# Patient Record
Sex: Female | Born: 1960 | Race: White | Hispanic: No | Marital: Married | Smoking: Current every day smoker
Health system: Southern US, Community
[De-identification: ages and names within clinical notes are randomized; demographics above are authoritative.]

---

## 2020-10-18 ENCOUNTER — Ambulatory Visit (INDEPENDENT_AMBULATORY_CARE_PROVIDER_SITE_OTHER): Payer: No Typology Code available for payment source

## 2020-10-18 ENCOUNTER — Other Ambulatory Visit: Payer: Self-pay

## 2020-10-18 ENCOUNTER — Ambulatory Visit (INDEPENDENT_AMBULATORY_CARE_PROVIDER_SITE_OTHER): Payer: No Typology Code available for payment source | Admitting: Orthopaedic Surgery

## 2020-10-18 ENCOUNTER — Encounter: Payer: Self-pay | Admitting: Orthopaedic Surgery

## 2020-10-18 VITALS — BP 125/87 | HR 93 | Ht 62.0 in | Wt 178.0 lb

## 2020-10-18 DIAGNOSIS — M545 Low back pain, unspecified: Secondary | ICD-10-CM

## 2020-10-18 NOTE — Progress Notes (Signed)
Office Visit Note   Patient: Nancy Mcgee           Date of Birth: 06-28-1961           MRN: [id number] Visit Date: 10/18/2020              Requested by: No referring provider defined for this encounter. PCP: No primary care provider on file.   Assessment & Plan: Visit Diagnoses:  1. Acute right-sided low back pain, unspecified whether sciatica present     Plan: Work slip given for no work x4 weeks.  We will set up physical therapy and I will recheck her in 4 weeks.  If she gets better in the interim feels like she can work half day she will call let us know.  X-ray results were reviewed.  Follow-Up Instructions: No follow-ups on file.   Orders:  Orders Placed This Encounter  Procedures  . XR Pelvis 1-2 Views  . XR Lumbar Spine 2-3 Views   No orders of the defined types were placed in this encounter.     Procedures: No procedures performed   Clinical Data: No additional findings.   Subjective: Chief Complaint  Patient presents with  . Lower Back - Pain    OTJI 09/19/2020  . 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 [facility].  Patient was injured on 09/19/2020 when she was pulling a case cart out of a cart washer and had to pull up and over and heard a sharp pop in her back.  She had significant pain in her back on the right side radiating into her right hip with soreness.  She has had problems standing up straight.  She states she normally walks 2 miles a day but try to walk after the injury and only made it 1/4 mile.  She does better if she is leaning over.  She cannot sit very long has trouble standing.  She tried doing some work and has been out of work since 09/26/2020.  She states the pain shoots in the right leg feels like electricity.   Patient states that she had one episode where her leg gave way she grabs something did not hit the floor.  She went to have  some chiropractic treatment when she was in [place].  She states she is sensitive some medication Tylenol did not help ibuprofen 800 mg took the edge off.  She has not tried Aleve for this.  She wants to avoid pain medication specifically narcotics or muscle relaxants.  Patient's had had some past problems with her back 2013 2019 with mild discomfort not severe or radicular-like current symptoms.  Review of Systems all other systems noncontributory to HPI.  Patient does have scoliosis.  No previous lumbar surgeries.   Objective: Vital Signs: BP 125/87   Pulse 93   Ht 5\' 2"  (1.575 m)   Wt 178 lb (80.7 kg)   BMI 32.56 kg/m   Physical Exam Constitutional:      Appearance: She is well-developed.  HENT:     Head: Normocephalic.     Right Ear: External ear normal.     Left Ear: External ear normal.  Eyes:     Pupils: Pupils are equal, round, and reactive to light.  Neck:     Thyroid: No thyromegaly.     Trachea: No tracheal deviation.  Cardiovascular:  Rate and Rhythm: Normal rate.  Pulmonary:     Effort: Pulmonary effort is normal.  Abdominal:     Palpations: Abdomen is soft.  Skin:    General: Skin is warm and dry.  Neurological:     Mental Status: She is alert and oriented to person, place, and time.  Psychiatric:        Behavior: Behavior normal.     Ortho Exam patient is able to ambulate.  She has trouble getting a fully upright position.  She has tenderness of the right sciatic notch moderately tenderness over the gluteus medius more severe in trochanteric bursa.  Increased pain and weakness with attempted flex right hip.  No problems with left hip flexion.  Quads are strong.  Positive straight leg raising on the right at 80 degrees positive contralateral straight leg raise 90 degrees on the left.  No sciatic notch tenderness.  EHL anterior tib gastrocsoleus is strong no atrophy good capillary refill pedal pulses are normal.  Specialty Comments:  No specialty  comments available.  Imaging: No results found.   PMFS History: There are no problems to display for this patient.  No past medical history on file.  No family history on file.   Social History   Occupational History  . Not on file  Tobacco Use  . Smoking status: Current Every Day Smoker  . Smokeless tobacco: Never Used  Substance and Sexual Activity  . Alcohol use: Yes    Comment: occasional  . Drug use: Not on file  . Sexual activity: Not on file

## 2020-10-18 NOTE — Addendum Note (Signed)
Addended by: Rogers Seeds on: 10/18/2020 10:55 AM   Modules accepted: Orders

## 2020-10-20 ENCOUNTER — Telehealth: Payer: Self-pay | Admitting: Orthopaedic Surgery

## 2020-10-20 NOTE — Telephone Encounter (Signed)
Peggy from [employer] called requesting an order for patient to receive physical therapy and she also is requesting office visit notes. Please call Peggy at [phone]. Please fax notes to [phone].

## 2020-10-20 NOTE — Telephone Encounter (Signed)
Another company by the name of [employer] ask for physical therapy orders be faxed to them at [phone].

## 2020-10-21 NOTE — Telephone Encounter (Signed)
Have you heard from Workers comp on this case? Someone from [facility] is needing PT orders and I dont want to give to them unless its legite

## 2020-10-21 NOTE — Telephone Encounter (Signed)
Can you help with this please.

## 2020-10-25 ENCOUNTER — Telehealth: Payer: Self-pay | Admitting: *Deleted

## 2020-10-25 NOTE — Telephone Encounter (Signed)
-----   Message from Bridgett Larsson sent at 10/24/2020 10:50 AM EDT ----- Regarding: SECURE  PT order for Shelda Pal  I forward the PT order to [facility] they possibly needed the signature of Dr. Ophelia Charter on the referral regarding the PT. This was already originally sent to Erasmo Downer Attn: Gigi Gin and she forwarded it them on 10/20/20 when I sent it to her. I have taken care of resending to [facility]. Thx Tisha

## 2020-11-30 ENCOUNTER — Ambulatory Visit (INDEPENDENT_AMBULATORY_CARE_PROVIDER_SITE_OTHER): Payer: No Typology Code available for payment source | Admitting: Orthopaedic Surgery

## 2020-11-30 ENCOUNTER — Encounter: Payer: Self-pay | Admitting: Orthopaedic Surgery

## 2020-11-30 DIAGNOSIS — M5416 Radiculopathy, lumbar region: Secondary | ICD-10-CM

## 2020-11-30 NOTE — Progress Notes (Signed)
Office Visit Note   Patient: Nancy Mcgee           Date of Birth: 07/08/1961           MRN: [id number] Visit Date: 11/30/2020              Requested by: No referring provider defined for this encounter. PCP: No primary care provider on file.   Assessment & Plan: Visit Diagnoses:  1. Lumbar back pain with radiculopathy affecting right lower extremity     Plan: Patient needs an MRI scan lumbar spine rule out disc protrusion with radiculopathy with weak hip flexion on the right.  Office follow-up after MRI scan for review.  Work slip given no work pending office visit for MRI review.  Follow-Up Instructions: No follow-ups on file.   Orders:  No orders of the defined types were placed in this encounter.  No orders of the defined types were placed in this encounter.     Procedures: No procedures performed   Clinical Data: No additional findings.   Subjective: Chief Complaint  Patient presents with  . Lower Back - Pain, Follow-up    HPI 60 year old female returns for follow-up Worker's Comp. injury date of injury was 08/2820.  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 was pulling a case cart out of a cart washer had to pull up and felt sharp pain in her back.  She has been through therapy which is not helping she is miserable states she is having great difficulty getting dressed she has problems walking outside take her dogs out.  She normally been extremely active.  She has back pain over the SI joint that radiates laterally to the crest into the left buttocks.  Pain radiates around to her groin.  She does have a history of kidney stones in the past on the right this was many years ago which states this pain is not like her kidney stone pain.  She denies any hematuria no frequency no chills or fever.  No associated bowel or bladder symptoms.  Therapy has not given her any relief.  She does have some Percocet at home from last year when she  had some plastic surgery procedure for postop pain but states she does not like to take the medication because of the way it makes her feel but she does have available.  She has been through chiropractic treatments, Tylenol, ibuprofen, prednisone injection.  Review of Systems 14 point system update unchanged.  Patient does have scoliosis no previous lumbar surgeries.   Objective: Vital Signs: BP 127/82   Pulse 80   Ht 5\' 2"  (1.575 m)   Wt 178 lb (80.7 kg)   BMI 32.56 kg/m   Physical Exam Constitutional:      Appearance: She is well-developed.  HENT:     Head: Normocephalic.     Right Ear: External ear normal.     Left Ear: External ear normal.  Eyes:     Pupils: Pupils are equal, round, and reactive to light.  Neck:     Thyroid: No thyromegaly.     Trachea: No tracheal deviation.  Cardiovascular:     Rate and Rhythm: Normal rate.  Pulmonary:     Effort: Pulmonary effort is normal.  Abdominal:     Palpations: Abdomen is soft.  Skin:    General: Skin is warm and dry.  Neurological:     Mental Status: She is alert and oriented to  person, place, and time.  Psychiatric:        Behavior: Behavior normal.     Ortho Exam patient has reproduction of pain with right hip flexion weakness.  Slight quad weakness abductor strong.  Some pain with straight leg raising 80 degrees on the right negative on the left.  Anterior tib EHL is intact no lower extremity swelling pulses are normal good capillary refill. Specialty Comments:  No specialty comments available.  Imaging: No results found.   PMFS History: Patient Active Problem List   Diagnosis Date Noted  . Lumbar back pain with radiculopathy affecting right lower extremity 11/30/2020   History reviewed. No pertinent past medical history.  History reviewed. No pertinent family history.  History reviewed. No pertinent surgical history. Social History   Occupational History  . Not on file  Tobacco Use  . Smoking status:  Current Every Day Smoker  . Smokeless tobacco: Never Used  Substance and Sexual Activity  . Alcohol use: Yes    Comment: occasional  . Drug use: Not on file  . Sexual activity: Not on file

## 2020-11-30 NOTE — Addendum Note (Signed)
Addended by: Barbette Or on: 11/30/2020 09:42 AM   Modules accepted: Orders

## 2020-12-02 ENCOUNTER — Telehealth: Payer: Self-pay

## 2020-12-02 NOTE — Telephone Encounter (Signed)
Shamika from [employer] called she is requesting a script for the patients mri she stated she is trying to get the patient scheduled call back:[phone] [phone]

## 2020-12-02 NOTE — Telephone Encounter (Signed)
Script has been faxed

## 2020-12-06 ENCOUNTER — Telehealth: Payer: Self-pay

## 2020-12-06 NOTE — Telephone Encounter (Signed)
Order was faxed.

## 2020-12-06 NOTE — Telephone Encounter (Signed)
Nancy Mcgee from [facility] [employer] called she is requesting a rx for mri of lumbar spine without contrast , the patient wants to be scheduled at [facility] call back:[phone]

## 2020-12-13 ENCOUNTER — Telehealth: Payer: Self-pay | Admitting: Orthopaedic Surgery

## 2020-12-13 NOTE — Telephone Encounter (Signed)
Sandy with [facility] [employer] called stating the MRI referral was missing the signed order and [facility] won't schedule until they have that. She would like this faxed to [facility] as soon as possible please.   [facility] F# [phone] Claim# [id number]

## 2020-12-13 NOTE — Telephone Encounter (Signed)
Can you send this or do I need to print and stamp?

## 2020-12-13 NOTE — Telephone Encounter (Signed)
noted 

## 2020-12-13 NOTE — Telephone Encounter (Signed)
Can you please fax for me?

## 2020-12-13 NOTE — Telephone Encounter (Signed)
Received call from Belva Agee case manager with [employer] needing a  Rx faxed to her for an MRI for the patient.  She advised [facility] do not have the referral. Peggy wants the Rx faxed directly to her.     The fax# is [phone]   The number to contact Gigi Gin is [phone]

## 2020-12-14 ENCOUNTER — Other Ambulatory Visit: Payer: Self-pay

## 2020-12-14 ENCOUNTER — Telehealth: Payer: Self-pay

## 2020-12-14 NOTE — Telephone Encounter (Signed)
Dr. Ophelia Charter wrote new rx today for MRI Lumbar that states MUST BE 1.5 TESSLA MAGNET.  New RX faxed to Peggy at [phone].

## 2020-12-14 NOTE — Telephone Encounter (Signed)
Tammy called and a new fax needs to be sent with the pts Date of Birth and stating that the MRI is without contrast.  Attn : Tammy   She stated she spoke with Dr. Ophelia Charter this am and the one he faxed over was without the DOB and Mri information   Fax # [phone]

## 2020-12-16 NOTE — Telephone Encounter (Signed)
Rx has been sent  

## 2020-12-20 ENCOUNTER — Other Ambulatory Visit: Payer: Self-pay

## 2020-12-20 ENCOUNTER — Ambulatory Visit
Admission: RE | Admit: 2020-12-20 | Discharge: 2020-12-20 | Disposition: A | Discharge status: Home / Self Care | Payer: Worker's Compensation | Source: Ambulatory Visit | Attending: Orthopaedic Surgery | Admitting: Orthopaedic Surgery

## 2020-12-20 DIAGNOSIS — M5416 Radiculopathy, lumbar region: Secondary | ICD-10-CM

## 2020-12-27 ENCOUNTER — Ambulatory Visit (INDEPENDENT_AMBULATORY_CARE_PROVIDER_SITE_OTHER): Payer: Worker's Compensation | Admitting: Orthopaedic Surgery

## 2020-12-27 ENCOUNTER — Encounter: Payer: Self-pay | Admitting: Orthopaedic Surgery

## 2020-12-27 VITALS — BP 117/80 | HR 86 | Ht 62.0 in | Wt 178.0 lb

## 2020-12-27 DIAGNOSIS — M5416 Radiculopathy, lumbar region: Secondary | ICD-10-CM

## 2020-12-27 DIAGNOSIS — M48061 Spinal stenosis, lumbar region without neurogenic claudication: Secondary | ICD-10-CM

## 2020-12-27 NOTE — Progress Notes (Signed)
Office Visit Note   Patient: Nancy Mcgee           Date of Birth: 07/01/1961           MRN: [id number] Visit Date: 12/27/2020              Requested by: No referring provider defined for this encounter. PCP: Pcp, No   Assessment & Plan: Visit Diagnoses:  1. Lumbar back pain with radiculopathy affecting right lower extremity   2. Lumbar foraminal stenosis     Plan: Patient scan shows significant degenerative changes at L1-2 with 5 mm retrolisthesis and moderate subarticular foraminal stenosis on the right consistent with her symptoms to radiate into the right groin and anterior thigh.  She does have some disc protrusion with severe stenosis at L4-5 but no neurogenic claudication symptoms.  Would recommend a FCE for determination of her job capacity at this point.  We discussed the endplate degenerative changes present at L1-2 on the right consistent with her current symptoms.  We discussed possible consideration of epidural injection in the foramina at some point.  She does have some curvature and does have some fairly severe foraminal stenosis at L4-5 but has not been having any claudication symptoms and also has some L3-4 moderate to severe stenosis.  At times she has had some lower back pain into her buttocks in the past but her current pain is likely related to the L1-2 level with disc protrusion and endplate edema.  We will obtain his FCE and see her back after FCE for work recommendations.  Follow-Up Instructions: No follow-ups on file.   Orders:  Orders Placed This Encounter  Procedures  . Ambulatory referral to Physical Therapy   No orders of the defined types were placed in this encounter.     Procedures: No procedures performed   Clinical Data: No additional findings.   Subjective: Chief Complaint  Patient presents with  . Lower Back - Pain, Follow-up    MRI lumbar review    HPI 60 year old female returns for follow-up Worker's Comp. injury from 09/19/2020.   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 [place] when she was pulling a case cart out of a cart washer and pulling hard on it she felt a sharp pain in her back.  Patient's had persistent problems with significant pain preventing her from work.  She has used prednisone, injection, Tylenol, ibuprofen.  She had some Percocet at home that she took used.  Patient states pain is been this severe, and can past standing and she significantly wants to get better so that she can get back to work.  Patient has been through therapeutic exercise with chiropractor.  Review of Systems systems update unchanged from 11/30/2020.  Of note is scoliosis.   Objective: Vital Signs: BP 117/80   Pulse 86   Ht 5\' 2"  (1.575 m)   Wt 178 lb (80.7 kg)   BMI 32.56 kg/m   Physical Exam  Ortho Exam  Specialty Comments:  No specialty comments available.  Imaging: CLINICAL DATA:  Low back pain.  Right buttock and hip pain.  EXAM: MRI LUMBAR SPINE WITHOUT CONTRAST  TECHNIQUE: Multiplanar, multisequence MR imaging of the lumbar spine was performed. No intravenous contrast was administered.  COMPARISON:  Lumbar spine radiographs 10/18/2020  FINDINGS: Segmentation:  Normal  Alignment:  Moderate levoscoliosis at L1-2.  5 mm retrolisthesis L1-2.  Mild anterolisthesis L4-5.  Vertebrae: Negative for fracture or mass. Bone marrow edema on  the right at L1-2 related to disc degeneration.  Conus medullaris and cauda equina: Conus extends to the L1-2 level. Conus and cauda equina appear normal.  Paraspinal and other soft tissues: Negative for paraspinous mass or adenopathy.  Disc levels:  T12-L1: Asymmetric disc degeneration and spurring on the right related to scoliosis. Moderate right foraminal stenosis.  L1-2: 5 mm retrolisthesis. Disc degeneration with diffuse endplate spurring, right greater than left. Bilateral mild facet degeneration. Mild spinal  stenosis. Moderate subarticular and foraminal stenosis on the right. Mild stenosis on the left  L2-3: Disc degeneration with broad-based central and left-sided disc protrusion. Associated endplate spurring. Bilateral facet degeneration. Moderate subarticular and foraminal stenosis on the left. Mild spinal stenosis  L3-4: Disc degeneration with disc bulging and endplate spurring. Disc degeneration asymmetric to the left. Moderate to advanced facet degeneration. Moderate to severe spinal stenosis. Moderate subarticular stenosis on the left. Moderate to severe subarticular and foraminal stenosis on the left  L4-5: Central disc protrusion and advanced facet degeneration. Severe spinal stenosis. Moderate to severe subarticular and foraminal stenosis on the left. Moderate right subarticular stenosis  L5-S1: Bilateral facet degeneration.  Negative for stenosis.  IMPRESSION: Lumbar scoliosis. Multilevel degenerative changes throughout the lumbar spine. Negative for fracture  Right foraminal stenosis T12-L1 and L1-2.  Moderate foraminal stenosis on the left at L2-3 with mild spinal stenosis  Moderate to severe spinal stenosis L3-4 with bilateral stenosis left greater than right  Severe spinal stenosis L4-5 with moderate to severe subarticular foraminal stenosis on the left.   Electronically Signed   By: Marlan Palau M.D.   On: 12/20/2020 17:17   PMFS History: Patient Active Problem List   Diagnosis Date Noted  . Lumbar foraminal stenosis 12/28/2020  . Lumbar back pain with radiculopathy affecting right lower extremity 11/30/2020   No past medical history on file.  No family history on file.  No past surgical history on file. Social History   Occupational History  . Not on file  Tobacco Use  . Smoking status: Current Every Day Smoker  . Smokeless tobacco: Never Used  Substance and Sexual Activity  . Alcohol use: Yes    Comment: occasional  . Drug use:  Not on file  . Sexual activity: Not on file

## 2020-12-28 DIAGNOSIS — M48061 Spinal stenosis, lumbar region without neurogenic claudication: Secondary | ICD-10-CM | POA: Insufficient documentation

## 2021-01-27 ENCOUNTER — Telehealth: Payer: Self-pay | Admitting: Orthopaedic Surgery

## 2021-01-27 NOTE — Telephone Encounter (Signed)
Pt called stating she is a WC pt and has a report from [facility] being faxed to Dr.Yates. She wants to make sure he receives it and has time to look over before her appt on 02/15/21. Pt would like a CB when our office gets this so she can know if she'll have to move her appt.   [phone]

## 2021-01-30 NOTE — Telephone Encounter (Signed)
Have you seen this paperwork?

## 2021-01-30 NOTE — Telephone Encounter (Signed)
She must be referring to the FCE. We have not received that (or any other report) as of yet. Thanks.

## 2021-01-31 ENCOUNTER — Telehealth: Payer: Self-pay | Admitting: Orthopaedic Surgery

## 2021-01-31 NOTE — Telephone Encounter (Signed)
You have not received this on patient have you?

## 2021-01-31 NOTE — Telephone Encounter (Signed)
Pt called requesting a call back. Pt has questions about if Dr. Ophelia Charter received her evaluation paperwork. Please call patient at [phone].

## 2021-01-31 NOTE — Telephone Encounter (Signed)
I left voicemail for patient. I have checked with both Tammy and Tisha and we have not received any paperwork. I did advise that Morrison Old has reached out to work comp [employer]. I am holding previous message so that I can follow up.

## 2021-02-01 NOTE — Telephone Encounter (Signed)
Have you received any new information?

## 2021-02-07 ENCOUNTER — Ambulatory Visit (INDEPENDENT_AMBULATORY_CARE_PROVIDER_SITE_OTHER): Payer: No Typology Code available for payment source | Admitting: Orthopaedic Surgery

## 2021-02-07 ENCOUNTER — Encounter: Payer: Self-pay | Admitting: Orthopaedic Surgery

## 2021-02-07 VITALS — BP 118/78 | HR 79 | Ht 63.0 in | Wt 188.0 lb

## 2021-02-07 DIAGNOSIS — M5416 Radiculopathy, lumbar region: Secondary | ICD-10-CM

## 2021-02-07 NOTE — Telephone Encounter (Signed)
Patient aware FCE has been received.

## 2021-02-07 NOTE — Progress Notes (Signed)
Office Visit Note   Patient: Nancy Mcgee           Date of Birth: May 18, 1961           MRN: [id number] Visit Date: 02/07/2021              Requested by: No referring provider defined for this encounter. PCP: Pcp, No   Assessment & Plan: Visit Diagnoses:  1. Lumbar back pain with radiculopathy affecting right lower extremity     Plan: Patient has had some improvement originally she could barely transfer out of her wheelchair had trouble standing could barely walk.  She does have moderately severe stenosis at L3-4 multifactorial.  Severe stenosis at L4-5 without neurogenic claudication symptoms or history of claudication.  Patient has foraminal stenosis at L1-2 on the right which seems to be the symptomatic level and recommend referral to Dr. Alvester Morin for single epidural steroid injection see if we get her some relief and improve her physical capacity.  Currently since she is not able to travel by car more than a short distance she is prohibited from working.  I plan to recheck her after the epidural.  FCE results and MRI scan was reviewed again with the patient and  her husband.  Follow-Up Instructions: Return in about 1 month (around 03/10/2021).   Orders:  Orders Placed This Encounter  Procedures   Ambulatory referral to Physical Medicine Rehab   No orders of the defined types were placed in this encounter.     Procedures: No procedures performed   Clinical Data: No additional findings.   Subjective: Chief Complaint  Patient presents with   Lower Back - Follow-up    FCE review    HPI 60 year old female returns for follow-up Worker's Comp. injury from 09/19/2020.  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  FCE has been obtained and is available for review.  Patient's MRI showed some degenerative changes L1-2 with 5 mm retrolisthesis and moderate foraminal  stenosis on the right.  Her pain symptoms radiate into the right groin and anterior thigh.  She did have severe stenosis at L4-5 however she had no neurogenic claudication symptoms and no pain that radiated to her feet.  She did have a past history of some back pain symptoms in the past.  FCE is reviewed with her today.  FCE says patient follows in a modified light duty routine some sit some stand & instruments up to 4 hours without performing lifting more than 5 pounds at a time.  She is able to perform light pushing or pulling activities.  Twisting within the amount of weight is not recommended.  Squatting test not recommended.  Her current job requires her to drive up to 13 to 14 hours per FCE.  This is not recommended due to her level of pain per FCE findings.  Review of Systems 14 point system update from 11/30/2020 unchanged.   Objective: Vital Signs: BP 118/78   Pulse 79   Ht 5\' 3"  (1.6 m)   Wt 188 lb (85.3 kg)   BMI 33.30 kg/m   Physical Exam Constitutional:      Appearance: She is well-developed.  HENT:     Head: Normocephalic.     Right Ear: External ear normal.     Left Ear: External ear normal. There is no impacted cerumen.  Eyes:     Pupils:  Pupils are equal, round, and reactive to light.  Neck:     Thyroid: No thyromegaly.     Trachea: No tracheal deviation.  Cardiovascular:     Rate and Rhythm: Normal rate.  Pulmonary:     Effort: Pulmonary effort is normal.  Abdominal:     Palpations: Abdomen is soft.  Musculoskeletal:     Cervical back: No rigidity.  Skin:    General: Skin is warm and dry.  Neurological:     Mental Status: She is alert and oriented to person, place, and time.  Psychiatric:        Behavior: Behavior normal.    Ortho Exam patient has pain with attempted resisted right hip flexion.  Slight quad weakness abductors are strong.  Negative logroll the hips.  Knees reach full extension.  Anterior tib EHL is intact.  Distal pulses  palpable.  Specialty Comments:  No specialty comments available.  Imaging: No results found.   PMFS History: Patient Active Problem List   Diagnosis Date Noted   Lumbar foraminal stenosis 12/28/2020   Lumbar back pain with radiculopathy affecting right lower extremity 11/30/2020   No past medical history on file.  No family history on file.  No past surgical history on file. Social History   Occupational History   Not on file  Tobacco Use   Smoking status: Every Day   Smokeless tobacco: Never  Substance and Sexual Activity   Alcohol use: Yes    Comment: occasional   Drug use: Not on file   Sexual activity: Not on file

## 2021-02-15 ENCOUNTER — Ambulatory Visit: Payer: Self-pay | Admitting: Orthopaedic Surgery

## 2021-02-17 ENCOUNTER — Telehealth: Payer: Self-pay | Admitting: Physical Medicine and Rehabilitation

## 2021-02-17 NOTE — Telephone Encounter (Signed)
Pt called stating she has a referral and she's a WC pt so she wants to make sure she doesn't miss any calls to set her appt.   [phone]

## 2021-03-01 ENCOUNTER — Ambulatory Visit (INDEPENDENT_AMBULATORY_CARE_PROVIDER_SITE_OTHER): Payer: No Typology Code available for payment source | Admitting: Physical Medicine and Rehabilitation

## 2021-03-01 ENCOUNTER — Ambulatory Visit: Payer: Self-pay

## 2021-03-01 ENCOUNTER — Encounter: Payer: Self-pay | Admitting: Physical Medicine and Rehabilitation

## 2021-03-01 ENCOUNTER — Other Ambulatory Visit: Payer: Self-pay

## 2021-03-01 VITALS — BP 104/72 | HR 86

## 2021-03-01 DIAGNOSIS — M5416 Radiculopathy, lumbar region: Secondary | ICD-10-CM

## 2021-03-01 DIAGNOSIS — M48061 Spinal stenosis, lumbar region without neurogenic claudication: Secondary | ICD-10-CM

## 2021-03-01 MED ORDER — DEXAMETHASONE SODIUM PHOSPHATE 10 MG/ML IJ SOLN
15.0000 mg | Freq: Once | INTRAMUSCULAR | Status: AC
  Administered 2021-03-01: 15 mg

## 2021-03-01 NOTE — Patient Instructions (Signed)

## 2021-03-01 NOTE — Progress Notes (Signed)
Pt state lower back pain that travels to her right hip. Pt state any movement makes the pain worse. Pt state uses ice to help ease her pain.  Numeric Pain Rating Scale and Functional Assessment Average Pain 6   In the last MONTH (on 0-10 scale) has pain interfered with the following?  1. General activity like being  able to carry out your everyday physical activities such as walking, climbing stairs, carrying groceries, or moving a chair?  Rating(10)   +Driver, -BT, -Dye Allergies.

## 2021-03-01 NOTE — Procedures (Signed)
Lumbosacral Transforaminal Epidural Steroid Injection - Sub-Pedicular Approach with Fluoroscopic Guidance  Patient: Nancy Mcgee      Date of Birth: Apr 25, 1961 MRN: [id number] PCP: Pcp, No      Visit Date: 03/01/2021   Universal Protocol:    Date/Time: 03/01/2021  Consent Given By: the patient  Position: PRONE  Additional Comments: Vital signs were monitored before and after the procedure. Patient was prepped and draped in the usual sterile fashion. The correct patient, procedure, and site was verified.   Injection Procedure Details:   Procedure diagnoses: Lumbar radiculopathy [M54.16]    Meds Administered:  Meds ordered this encounter  Medications   dexamethasone (DECADRON) injection 15 mg    Laterality: Right  Location/Site:  L1-L2  Needle:5.0 in., 22 ga.  Short bevel or Quincke spinal needle  Needle Placement: Transforaminal  Findings:    -Comments: Excellent flow of contrast along the nerve, nerve root and into the epidural space.  Procedure Details: After squaring off the end-plates to get a true AP view, the C-arm was positioned so that an oblique view of the foramen as noted above was visualized. The target area is just inferior to the "nose of the scotty dog" or sub pedicular. The soft tissues overlying this structure were infiltrated with 2-3 ml. of 1% Lidocaine without Epinephrine.  The spinal needle was inserted toward the target using a "trajectory" view along the fluoroscope beam.  Under AP and lateral visualization, the needle was advanced so it did not puncture dura and was located close the 6 O'Clock position of the pedical in AP tracterory. Biplanar projections were used to confirm position. Aspiration was confirmed to be negative for CSF and/or blood. A 1-2 ml. volume of Isovue-250 was injected and flow of contrast was noted at each level. Radiographs were obtained for documentation purposes.   After attaining the desired flow of contrast documented  above, a 0.5 to 1.0 ml test dose of 0.25% Marcaine was injected into each respective transforaminal space.  The patient was observed for 90 seconds post injection.  After no sensory deficits were reported, and normal lower extremity motor function was noted,   the above injectate was administered so that equal amounts of the injectate were placed at each foramen (level) into the transforaminal epidural space.   Additional Comments:  The patient tolerated the procedure well Dressing: 2 x 2 sterile gauze and Band-Aid    Post-procedure details: Patient was observed during the procedure. Post-procedure instructions were reviewed.  Patient left the clinic in stable condition.

## 2021-03-01 NOTE — Progress Notes (Signed)
Nancy Mcgee - 60 y.o. female MRN [id number]  Date of birth: January 18, 1961  Office Visit Note: Visit Date: 03/01/2021 PCP: Pcp, No Referred by: Eldred Manges, MD  Subjective: Chief Complaint  Patient presents with   Lower Back - Pain   Right Hip - Pain   HPI:  [name] is a 60 y.o. female who comes in today at the request of Dr. Annell Greening for planned Right L1-L2 Lumbar Transforaminal epidural steroid injection with fluoroscopic guidance.  The patient has failed conservative care including home exercise, medications, time and activity modification.  This injection will be diagnostic and hopefully therapeutic.  Please see requesting physician notes for further details and justification. MRI reviewed with images and spine model.  MRI reviewed in the note below. She reports lower back pain that travels to her right hip     ROS Otherwise per HPI.  Assessment & Plan: Visit Diagnoses:    ICD-10-CM   1. Lumbar radiculopathy  M54.16 XR C-ARM NO REPORT    Epidural Steroid injection    dexamethasone (DECADRON) injection 15 mg    2. 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 Spinal stenosis of lumbar region without neurogenic claudication  M48.061       Plan: No additional findings.   Meds & Orders:  Meds ordered this encounter  Medications   dexamethasone (DECADRON) injection 15 mg    Orders Placed This Encounter  Procedures   XR C-ARM NO REPORT   Epidural Steroid injection    Follow-up: Return for Annell Greening, MD.   Procedures: No procedures performed  Lumbosacral Transforaminal Epidural Steroid Injection - Sub-Pedicular Approach with Fluoroscopic Guidance  Patient: Nancy Mcgee      Date of Birth: 29-Jul-1960 MRN: [id number] PCP: Pcp, No      Visit Date: 03/01/2021   Universal Protocol:    Date/Time: 03/01/2021  Consent Given By: the patient  Position: PRONE  Additional Comments: Vital signs were monitored before and after the procedure. Patient was prepped  and draped in the usual sterile fashion. The correct patient, procedure, and site was verified.   Injection Procedure Details:   Procedure diagnoses: Lumbar radiculopathy [M54.16]    Meds Administered:  Meds ordered this encounter  Medications   dexamethasone (DECADRON) injection 15 mg    Laterality: Right  Location/Site:  L1-L2  Needle:5.0 in., 22 ga.  Short bevel or Quincke spinal needle  Needle Placement: Transforaminal  Findings:    -Comments: Excellent flow of contrast along the nerve, nerve root and into the epidural space.  Procedure Details: After squaring off the end-plates to get a true AP view, the C-arm was positioned so that an oblique view of the foramen as noted above was visualized. The target area is just inferior to the "nose of the scotty dog" or sub pedicular. The soft tissues overlying this structure were infiltrated with 2-3 ml. of 1% Lidocaine without Epinephrine.  The spinal needle was inserted toward the target using a "trajectory" view along the fluoroscope beam.  Under AP and lateral visualization, the needle was advanced so it did not puncture dura and was located close the 6 O'Clock position of the pedical in AP tracterory. Biplanar projections were used to confirm position. Aspiration was confirmed to be negative for CSF and/or blood. A 1-2 ml. volume of Isovue-250 was injected and flow of contrast was noted at each level. Radiographs were obtained for documentation purposes.   After attaining the desired flow of contrast documented above, a  0.5 to 1.0 ml test dose of 0.25% Marcaine was injected into each respective transforaminal space.  The patient was observed for 90 seconds post injection.  After no sensory deficits were reported, and normal lower extremity motor function was noted,   the above injectate was administered so that equal amounts of the injectate were placed at each foramen (level) into the transforaminal epidural space.   Additional  Comments:  The patient tolerated the procedure well Dressing: 2 x 2 sterile gauze and Band-Aid    Post-procedure details: Patient was observed during the procedure. Post-procedure instructions were reviewed.  Patient left the clinic in stable condition.    Clinical History: MRI LUMBAR SPINE WITHOUT CONTRAST   TECHNIQUE: Multiplanar, multisequence MR imaging of the lumbar spine was performed. No intravenous contrast was administered.   COMPARISON:  Lumbar spine radiographs 10/18/2020   FINDINGS: Segmentation:  Normal   Alignment:  Moderate levoscoliosis at L1-2.   5 mm retrolisthesis L1-2.  Mild anterolisthesis L4-5.   Vertebrae: Negative for fracture or mass. Bone marrow edema on the right at L1-2 related to disc degeneration.   Conus medullaris and cauda equina: Conus extends to the L1-2 level. Conus and cauda equina appear normal.   Paraspinal and other soft tissues: Negative for paraspinous mass or adenopathy.   Disc levels:   T12-L1: Asymmetric disc degeneration and spurring on the right related to scoliosis. Moderate right foraminal stenosis.   L1-2: 5 mm retrolisthesis. Disc degeneration with diffuse endplate spurring, right greater than left. Bilateral mild facet degeneration. Mild spinal stenosis. Moderate subarticular and foraminal stenosis on the right. Mild stenosis on the left   L2-3: Disc degeneration with broad-based central and left-sided disc protrusion. Associated endplate spurring. Bilateral facet degeneration. Moderate subarticular and foraminal stenosis on the left. Mild spinal stenosis   L3-4: Disc degeneration with disc bulging and endplate spurring. Disc degeneration asymmetric to the left. Moderate to advanced facet degeneration. Moderate to severe spinal stenosis. Moderate subarticular stenosis on the left. Moderate to severe subarticular and foraminal stenosis on the left   L4-5: Central disc protrusion and advanced facet  degeneration. Severe spinal stenosis. Moderate to severe subarticular and foraminal stenosis on the left. Moderate right subarticular stenosis   L5-S1: Bilateral facet degeneration.  Negative for stenosis.   IMPRESSION: Lumbar scoliosis. Multilevel degenerative changes throughout the lumbar spine. Negative for fracture   Right foraminal stenosis T12-L1 and L1-2.   Moderate foraminal stenosis on the left at L2-3 with mild spinal stenosis   Moderate to severe spinal stenosis L3-4 with bilateral stenosis left greater than right   Severe spinal stenosis L4-5 with moderate to severe subarticular foraminal stenosis on the left.     Electronically Signed   By: Marlan Palau M.D.   On: 12/20/2020 17:17     Objective:  VS:  HT:    WT:   BMI:     BP:104/72  HR:86bpm  TEMP: ( )  RESP:  Physical Exam Vitals and nursing note reviewed.  Constitutional:      General: She is not in acute distress.    Appearance: Normal appearance. She is not ill-appearing.  HENT:     Head: Normocephalic and atraumatic.     Right Ear: External ear normal.     Left Ear: External ear normal.  Eyes:     Extraocular Movements: Extraocular movements intact.  Cardiovascular:     Rate and Rhythm: Normal rate.     Pulses: Normal pulses.  Pulmonary:     Effort:  Pulmonary effort is normal. No respiratory distress.  Abdominal:     General: There is no distension.     Palpations: Abdomen is soft.  Musculoskeletal:        General: Tenderness present.     Cervical back: Neck supple.     Right lower leg: No edema.     Left lower leg: No edema.     Comments: Patient has good distal strength with no pain over the greater trochanters.  No clonus or focal weakness.  Skin:    Findings: No erythema, lesion or rash.  Neurological:     General: No focal deficit present.     Mental Status: She is alert and oriented to person, place, and time.     Sensory: No sensory deficit.     Motor: No weakness or  abnormal muscle tone.     Coordination: Coordination normal.  Psychiatric:        Mood and Affect: Mood normal.        Behavior: Behavior normal.     Imaging: No results found.

## 2021-03-14 ENCOUNTER — Other Ambulatory Visit: Payer: Self-pay

## 2021-03-14 ENCOUNTER — Encounter: Payer: Self-pay | Admitting: Orthopaedic Surgery

## 2021-03-14 ENCOUNTER — Ambulatory Visit (INDEPENDENT_AMBULATORY_CARE_PROVIDER_SITE_OTHER): Payer: No Typology Code available for payment source | Admitting: Orthopaedic Surgery

## 2021-03-14 VITALS — BP 130/85 | Ht 63.0 in | Wt 188.0 lb

## 2021-03-14 DIAGNOSIS — M5416 Radiculopathy, lumbar region: Secondary | ICD-10-CM | POA: Diagnosis not present

## 2021-03-14 DIAGNOSIS — M48061 Spinal stenosis, lumbar region without neurogenic claudication: Secondary | ICD-10-CM | POA: Diagnosis not present

## 2021-03-15 NOTE — Progress Notes (Signed)
Office Visit Note   Patient: Nancy Mcgee           Date of Birth: 1961/04/11           MRN: [id number] Visit Date: 03/14/2021              Requested by: No referring provider defined for this encounter. PCP: Pcp, No   Assessment & Plan: Visit Diagnoses:  1. Lumbar back pain with radiculopathy affecting right lower extremity   2. Lumbar foraminal stenosis     Plan: Patient has significant two-level lumbar stenosis moderate severe at L3 -4 and severe at 4 5 without instability at that level.  She also has some significant disc degeneration with retrolisthesis and some foraminal stenosis at L1-2.  I like her to see Dr. Otelia Sergeant for second opinion for his recommendations.  I gave her a note out of work  for  4 weeks pending that evaluation and return visit with me.  Follow-Up Instructions: No follow-ups on file.   Orders:  Orders Placed This Encounter  Procedures   Ambulatory referral to Orthopedic Surgery   No orders of the defined types were placed in this encounter.     Procedures: No procedures performed   Clinical Data: No additional findings.   Subjective: Chief Complaint  Patient presents with   Lower Back - Pain, Follow-up    OTJI 09/19/2020    HPI 60 year old female returns for follow-up with on-the-job injury 09/19/2020.  Patient had a recent epidural 03/01/2021 she states it took the edge off her pain for 4 days and then recurred just as severe.  She has used a TENS unit, inversion table.  She brought a new mattress and may not been able to sleep on it she bought a second mattress.  She has tried ibuprofen and Aleve.  She tried oxycodone leftover from previous surgery and states it really did not help.  She has pain that radiates from the sacral dimple area over the SI joint into the sciatic notch and she rates it as constant and severe she has difficulty walking standing and moving.  She has a rehab nurse Andris Flurry, RN, CCM assigned to her.  Fax number  [phone].  Injection was at the L1-2 level where she had Mobic endplate changes and 5 mm retrolisthesis at L1-2.  Patient has significant stenosis severe at L4-5 and moderate to severe at L3-4 level but had not had problems with pain or neurogenic claudication symptoms prior to her on-the-job injury 09/19/2020.  Patient been treated by chiropractor.  Medications muscle relaxants, anti-inflammatory, prednisone Dosepak.  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  She was pulling a case cart out of a cart washer and had to pull and felt sharp pop in her back.  In the past she is to walk 2 miles a day.  Cannot stand long and has problems walking does better if she leans over a grocery cart.  Patient and had some problems mild with her back back in 2013 and another episode in 2019 which resolved and she is back to normal activity.  When first seen patient been in a wheelchair.  She is ambulatory now.  She has difficulty traveling by car.  FCE was done showed she was compatible with modified light duty some sitting some standing up for hours without lifting more than 5 pounds.  Okay for some light pushing and pulling.  Twisting holding weights was not recommended.  Squatting  test should be avoided.  Her previous job involves significant driving to different hospitals which currently she is not able to do.  Review of Systems 14 point system update she does have scoliosis all other systems are noncontributory to HPI.  No associated bowel or bladder symptoms.  No other rheumatologic conditions.   Objective: Vital Signs: BP 130/85   Ht 5\' 3"  (1.6 m)   Wt 188 lb (85.3 kg)   BMI 33.30 kg/m   Physical Exam Constitutional:      Appearance: She is well-developed.  HENT:     Head: Normocephalic.     Right Ear: External ear normal.     Left Ear: External ear normal. There is no impacted cerumen.  Eyes:     Pupils: Pupils are equal, round, and reactive to light.   Neck:     Thyroid: No thyromegaly.     Trachea: No tracheal deviation.  Cardiovascular:     Rate and Rhythm: Normal rate.  Pulmonary:     Effort: Pulmonary effort is normal.  Abdominal:     Palpations: Abdomen is soft.  Musculoskeletal:     Cervical back: No rigidity.  Skin:    General: Skin is warm and dry.  Neurological:     Mental Status: She is alert and oriented to person, place, and time.  Psychiatric:        Behavior: Behavior normal.    Ortho Exam patient has trouble getting from sitting standing.  Slow deliberate gait.  Tenderness over the right sacroiliac joint and pain over the sciatic notch.  Negative logroll of the hips.  2+ symmetrical lower extremity reflexes.  EHL anterior tib is strong no atrophy.  Distal pulses are intact.  She complains of pain with flexion of the right hip.  Internal and external rotation of the hips are not painful negative logroll.  No pitting edema.  Specialty Comments:  No specialty comments available.  Imaging: No results found.   PMFS History: Patient Active Problem List   Diagnosis Date Noted   Lumbar foraminal stenosis 12/28/2020   Lumbar back pain with radiculopathy affecting right lower extremity 11/30/2020   No past medical history on file.  No family history on file.  No past surgical history on file. Social History   Occupational History   Not on file  Tobacco Use   Smoking status: Every Day   Smokeless tobacco: Never  Substance and Sexual Activity   Alcohol use: Yes    Comment: occasional   Drug use: Not on file   Sexual activity: Not on file

## 2021-03-28 ENCOUNTER — Telehealth: Payer: Self-pay | Admitting: Orthopaedic Surgery

## 2021-03-28 NOTE — Telephone Encounter (Signed)
Please advise 

## 2021-03-28 NOTE — Telephone Encounter (Signed)
I had to put it in as a referral to see Dr. Otelia Sergeant for a second opinion. 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 Hold for [place] for the gabapentin?

## 2021-03-28 NOTE — Telephone Encounter (Signed)
Pt is wondering if she could get some gabapentin prescribed? She would like for someone to call her.   CB [phone]

## 2021-03-29 NOTE — Telephone Encounter (Signed)
Please see below and advise on gabapentin. Thanks.

## 2021-04-03 MED ORDER — GABAPENTIN 100 MG PO CAPS: 100.0000 mg | ORAL_CAPSULE | Freq: Three times a day (TID) | ORAL | 1 refills | Status: DC

## 2021-04-03 NOTE — Telephone Encounter (Signed)
Sent to pharmacy. I called patient and advised. 

## 2021-04-03 NOTE — Addendum Note (Signed)
Addended by: Rogers Seeds on: 04/03/2021 11:29 AM   Modules accepted: Orders

## 2021-05-19 ENCOUNTER — Telehealth: Payer: Self-pay | Admitting: Orthopaedic Surgery

## 2021-05-19 NOTE — Telephone Encounter (Signed)
Andris Flurry with Patent examiner (workers comp) called stating that pt is needing to have all lower back MRI's and xrays to have on a disc. She needs this for IME adjuster and has an appt in [place] to provide these images to them. Bonita Quin says to call pt once ready for p/u and to also keep her in the loop. Her call back is [phone]

## 2021-05-22 NOTE — Telephone Encounter (Signed)
I spoke with Nancy Mcgee at [facility] and they will get CD burned and ready for pick up. I called patient and Nancy Mcgee and advised, CDs ready here and at [facility].

## 2021-05-22 NOTE — Telephone Encounter (Signed)
Could you please burn xrays to CD for me? I will call and let them know she has to pick up MRI at [facility]. Thanks.

## 2021-06-03 ENCOUNTER — Other Ambulatory Visit: Payer: Self-pay | Admitting: Orthopaedic Surgery

## 2021-06-05 NOTE — Telephone Encounter (Signed)
Please advise 

## 2021-07-11 ENCOUNTER — Encounter: Payer: Self-pay | Admitting: Orthopaedic Surgery

## 2021-07-11 ENCOUNTER — Ambulatory Visit (INDEPENDENT_AMBULATORY_CARE_PROVIDER_SITE_OTHER): Payer: No Typology Code available for payment source | Admitting: Orthopaedic Surgery

## 2021-07-11 ENCOUNTER — Other Ambulatory Visit: Payer: Self-pay

## 2021-07-11 VITALS — BP 127/83 | HR 82 | Ht 63.11 in | Wt 189.0 lb

## 2021-07-11 DIAGNOSIS — M5416 Radiculopathy, lumbar region: Secondary | ICD-10-CM | POA: Diagnosis not present

## 2021-07-11 DIAGNOSIS — M48061 Spinal stenosis, lumbar region without neurogenic claudication: Secondary | ICD-10-CM

## 2021-07-11 NOTE — Progress Notes (Signed)
Office Visit Note   Patient: Nancy Mcgee           Date of Birth: 18-Jan-1961           MRN: [id number] Visit Date: 07/11/2021              Requested by: No referring provider defined for this encounter. PCP: Pcp, No   Assessment & Plan: Visit Diagnoses:  1. Lumbar back pain with radiculopathy affecting right lower extremity   2. Lumbar foraminal stenosis     Plan: We reviewed recommendations of her IME.  We reviewed her MRI scan once again images and report.  We will set up for right SI joint under fluoroscopy.  If this is not effective we will proceed with right transforaminal epidural steroid injection to see if this helps.  Once again I discussed with her that I do not have a specific surgical recommendation at this time.  Work slip given no work pending office follow-up after her injection.  Follow-Up Instructions: No follow-ups on file.   Orders:  Orders Placed This Encounter  Procedures   Ambulatory referral to Physical Medicine Rehab   No orders of the defined types were placed in this encounter.     Procedures: No procedures performed   Clinical Data: No additional findings.   Subjective: Chief Complaint  Patient presents with   Lower Back - Pain    OTJI 09/19/2020    HPI 60 year old female returns for follow-up Worker's Comp. injury 09/19/2020.  She has scoliosis, endplate degenerative changes, some stenosis L3-4 L4-5.  I had requested she be seen my partner Dr. Otelia Sergeant for additional opinion and Worker's Comp. sent her to [place] to [employer] for an IME.  This is available for review and also can be viewed in epic.  He mentioned her symptoms on the right were little different some areas that look like they were tighter on the left.  Problems with the dressing part of the problem where she has endplate degenerative changes and problems with scoliosis requiring more extensive stabilization problems were discussed in his note similar to my notes.  He  recommended trying a right sacroiliac injection if that was not helpful try a right transforaminal epidural injection.  Patient states she is very interested in getting back to work and states she loves her job but just does not know what she is going to do since she is in significant pain.  Review of Systems updated unchanged.   Objective: Vital Signs: BP 127/83    Pulse 82    Ht 5' 3.11" (1.603 m)    Wt 189 lb (85.7 kg)    BMI 33.36 kg/m   Physical Exam Constitutional:      Appearance: She is well-developed.  HENT:     Head: Normocephalic.     Right Ear: External ear normal.     Left Ear: External ear normal. There is no impacted cerumen.  Eyes:     Pupils: Pupils are equal, round, and reactive to light.  Neck:     Thyroid: No thyromegaly.     Trachea: No tracheal deviation.  Cardiovascular:     Rate and Rhythm: Normal rate.  Pulmonary:     Effort: Pulmonary effort is normal.  Abdominal:     Palpations: Abdomen is soft.  Musculoskeletal:     Cervical back: No rigidity.  Skin:    General: Skin is warm and dry.  Neurological:     Mental Status: She is alert and  oriented to person, place, and time.  Psychiatric:        Behavior: Behavior normal.    Ortho Exam patient is ambulatory she points directly over the right SI joint where she is having pain.  She leans on the exam room counter with her arms help unload her back.  Anterior tib gastrocsoleus is intact.  Specialty Comments:  No specialty comments available.  Imaging: No results found.   PMFS History: Patient Active Problem List   Diagnosis Date Noted   Lumbar foraminal stenosis 12/28/2020   Lumbar back pain with radiculopathy affecting right lower extremity 11/30/2020   No past medical history on file.  No family history on file.  No past surgical history on file. Social History   Occupational History   Not on file  Tobacco Use   Smoking status: Every Day   Smokeless tobacco: Never  Substance and  Sexual Activity   Alcohol use: Yes    Comment: occasional   Drug use: Not on file   Sexual activity: Not on file

## 2021-07-26 ENCOUNTER — Other Ambulatory Visit: Payer: Self-pay | Admitting: Orthopaedic Surgery

## 2021-10-16 ENCOUNTER — Telehealth: Payer: Self-pay | Admitting: Orthopaedic Surgery

## 2021-10-16 NOTE — Telephone Encounter (Signed)
I spoke with patient. She is going to email me copies of her insurance cards so that we can scan and send to Dr. Macomb Blas scheduler to obtain authorization. ?

## 2021-10-16 NOTE — Telephone Encounter (Signed)
I called patient, call went to voicemail, no indication of who phone belongs to, no message left. ?Will try again. ?

## 2021-10-16 NOTE — Telephone Encounter (Signed)
Pt called requesting a call back from [place]. Pt states she was denied from [facility] to receive injection. Pt would like to pay out of pocket. Please call pt to set this appt with name of injection and price. Pt phone number is [phone] ?

## 2021-10-19 NOTE — Telephone Encounter (Signed)
I  have emailed patient's insurance cards to you. Could you please upload information to system so that I can reach out to Dr. Alvester Morin to see about scheduling? Work comp denied her injection, so she wants to Museum/gallery curator. ?Thanks. ?

## 2021-12-06 ENCOUNTER — Other Ambulatory Visit: Payer: Self-pay | Admitting: Radiology

## 2021-12-06 ENCOUNTER — Telehealth: Payer: Self-pay | Admitting: Radiology

## 2021-12-06 DIAGNOSIS — M5416 Radiculopathy, lumbar region: Secondary | ICD-10-CM

## 2021-12-06 NOTE — Telephone Encounter (Signed)
FYI ? ?Received email from patient inquiring why she had not heard from anyone to schedule injection with Dr. Alvester Morin. She was originally denied by work comp and then decided to Museum/gallery curator. Cassandra uploaded new insurance card for patient into system for me. ? ?I entered new referral. I emailed patient to advise that I am sorry, referral notes to file regular insurance weren't entered, so they did not know to reach out to her. I advised that we will obtain authorization and someone from Dr. Marion Blas area will call her to schedule. ?

## 2021-12-07 ENCOUNTER — Other Ambulatory Visit: Payer: Self-pay | Admitting: Physical Medicine and Rehabilitation

## 2021-12-07 DIAGNOSIS — M461 Sacroiliitis, not elsewhere classified: Secondary | ICD-10-CM

## 2022-02-27 ENCOUNTER — Other Ambulatory Visit: Payer: Self-pay | Admitting: Orthopaedic Surgery

## 2022-03-16 IMAGING — MR MR LUMBAR SPINE W/O CM
4 of 5 series · 24 of 48 positions shown · non-contrast
Comparison: Lumbar spine radiographs 10/18/2020

CLINICAL DATA: Low back pain.  Right buttock and hip pain.

EXAM:
MRI LUMBAR SPINE WITHOUT CONTRAST
TECHNIQUE: Multiplanar, multisequence MR imaging of the lumbar spine was
performed. No intravenous contrast was administered.

[Series 3: T2 · sagittal · 4.0mm · 0.53mm/px · 6 of 16 slices shown (1 of 2)]
[im 1/16]
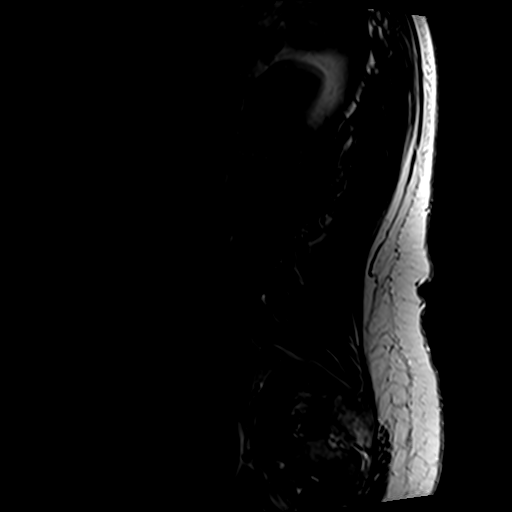
[im 4/16]
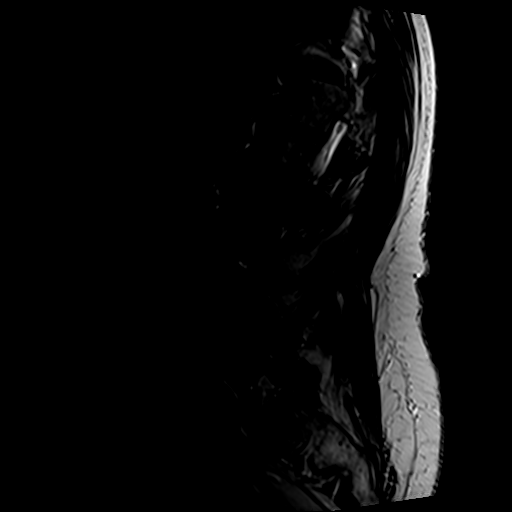
[im 7/16]
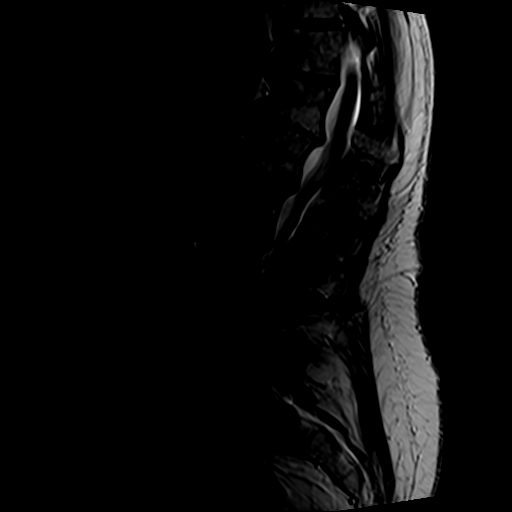
[im 10/16]
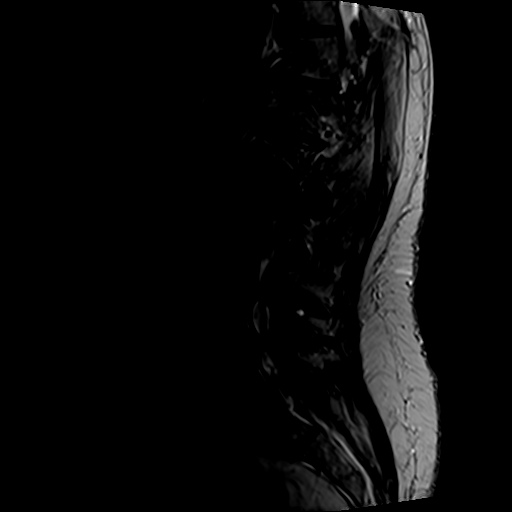
[im 13/16]
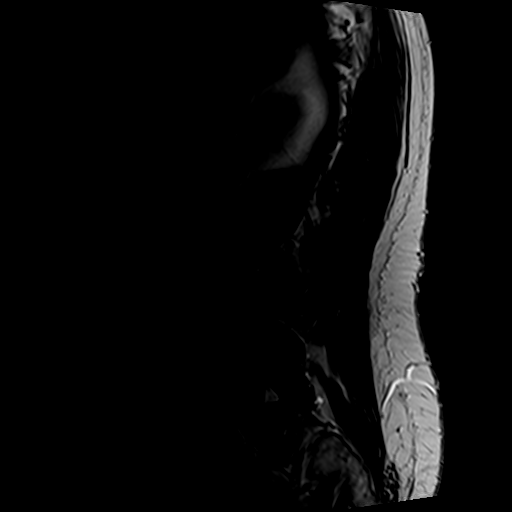
[im 16/16]
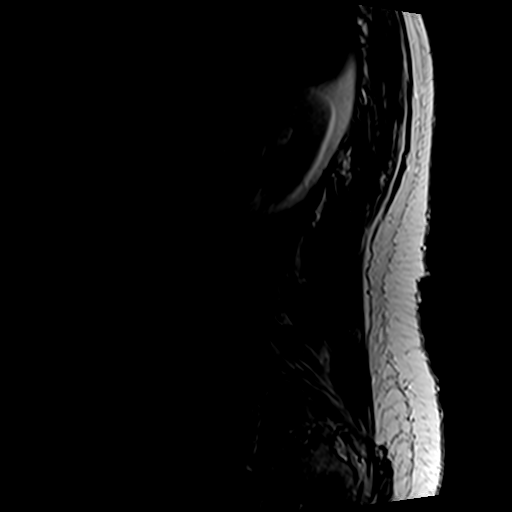

[Series 5: T1 · sagittal · 4.0mm · 0.53mm/px · 7 of 16 slices shown (1 of 2)]
[im 1/16]
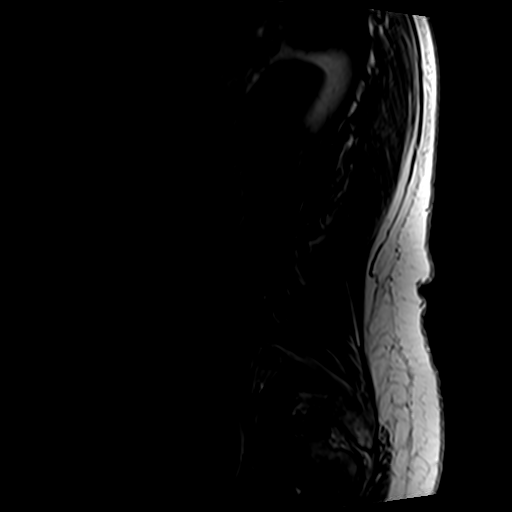
[im 3/16]
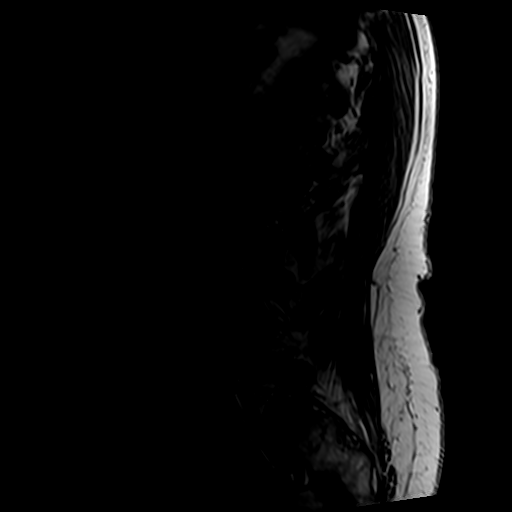
[im 6/16]
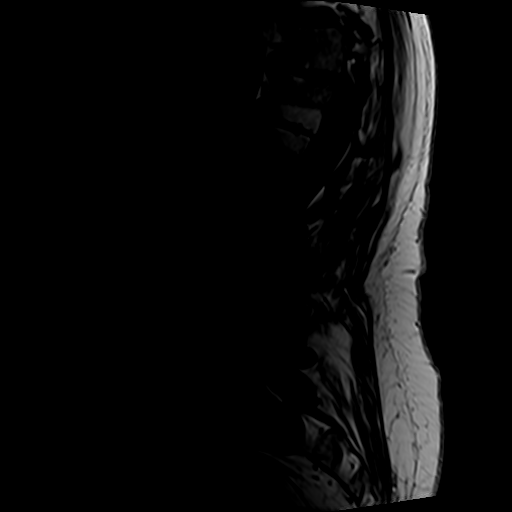
[im 8/16]
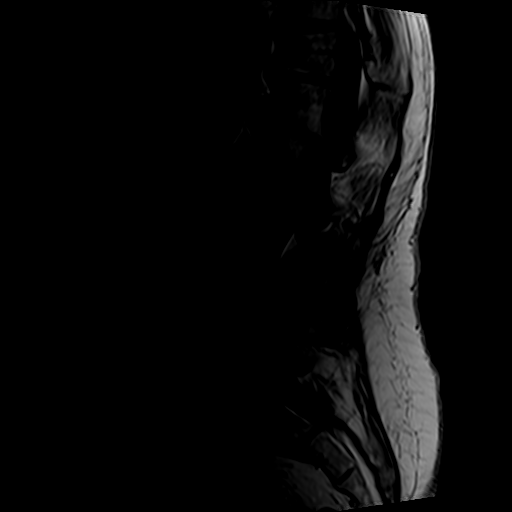
[im 11/16]
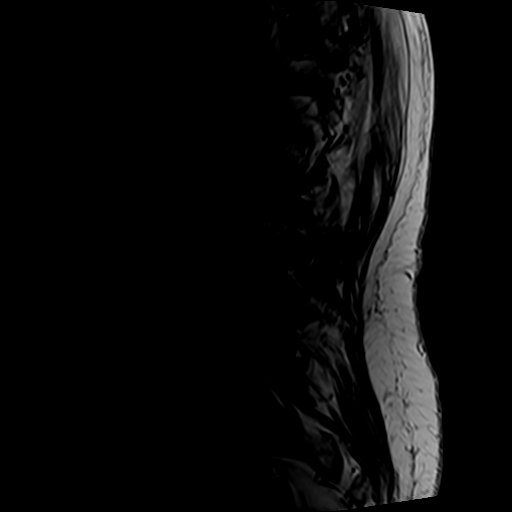
[im 13/16]
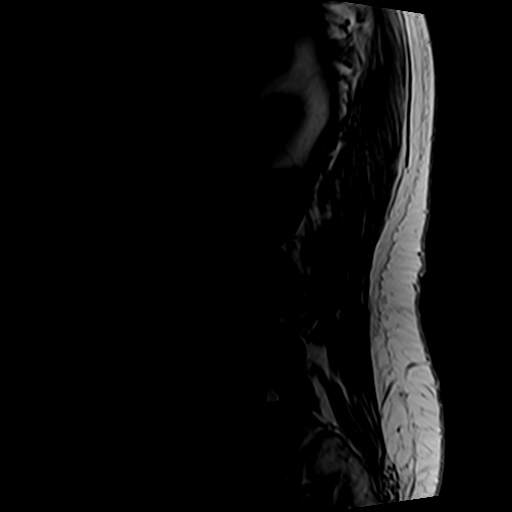
[im 16/16]
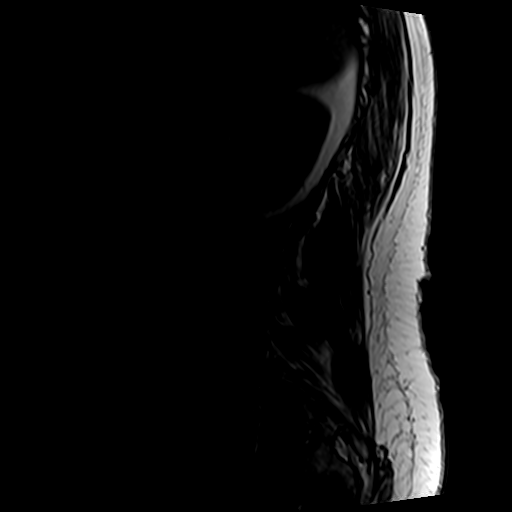

[Series 6: T2 · axial · 4.0mm · 0.70mm/px · z∈[-27,+184]mm · 8 of 32 slices shown (2 of 2)]
[im 1/32]
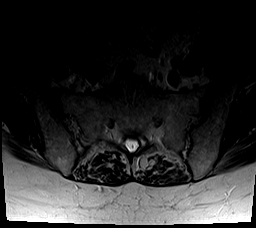
[im 5/32]
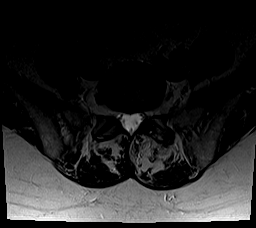
[im 10/32]
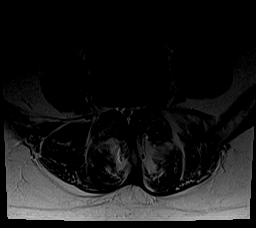
[im 15/32]
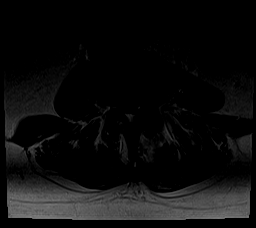
[im 17/32]
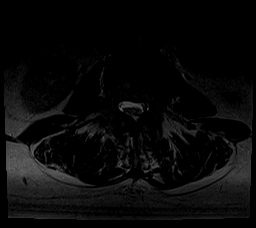
[im 22/32]
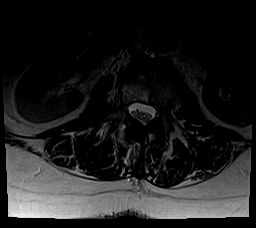
[im 27/32]
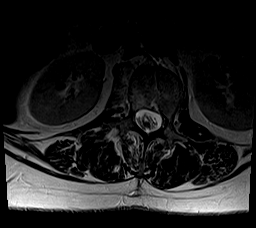
[im 32/32]
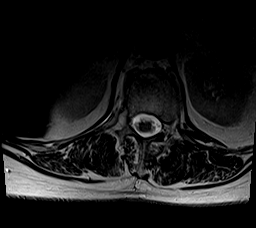

[Series 7: T1 · axial · 4.0mm · 0.35mm/px · z∈[-7,+160]mm · 3 of 32 slices shown (2 of 2)]
[im 5/32]
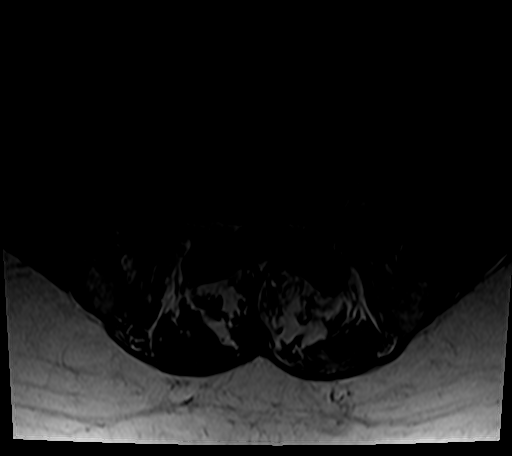
[im 17/32]
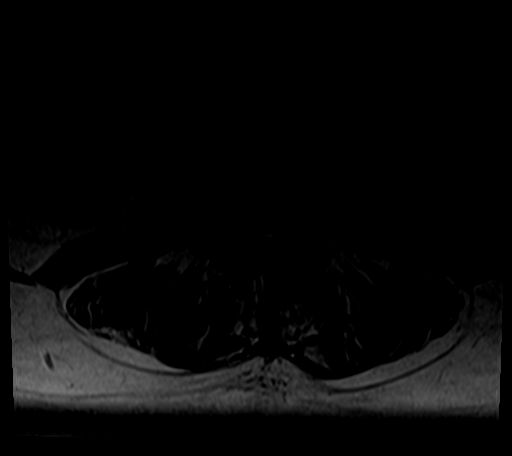
[im 27/32]
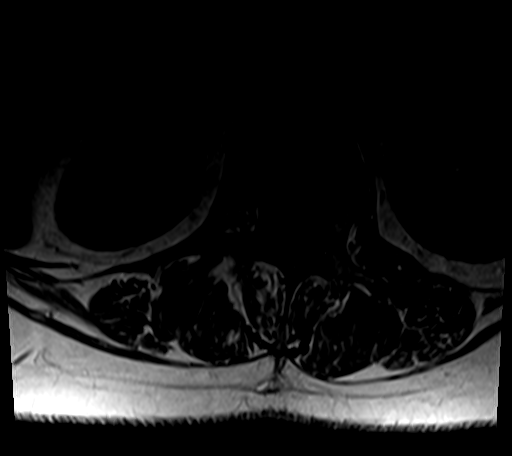

[24 of 48 positions shown; findings below may reference images not displayed]

FINDINGS: Segmentation:  Normal

Alignment:  Moderate levoscoliosis at L1-2.

5 mm retrolisthesis L1-2.  Mild anterolisthesis L4-5.

Vertebrae: Negative for fracture or mass. Bone marrow edema on the
right at L1-2 related to disc degeneration.

Conus medullaris and cauda equina: Conus extends to the L1-2 level.
Conus and cauda equina appear normal.

Paraspinal and other soft tissues: Negative for paraspinous mass or
adenopathy.

Disc levels:

T12-L1: Asymmetric disc degeneration and spurring on the right
related to scoliosis. Moderate right foraminal stenosis.

L1-2: 5 mm retrolisthesis. Disc degeneration with diffuse endplate
spurring, right greater than left. Bilateral mild facet
degeneration. Mild spinal stenosis. Moderate subarticular and
foraminal stenosis on the right. Mild stenosis on the left

L2-3: Disc degeneration with broad-based central and left-sided disc
protrusion. Associated endplate spurring. Bilateral facet
degeneration. Moderate subarticular and foraminal stenosis on the
left. Mild spinal stenosis

L3-4: Disc degeneration with disc bulging and endplate spurring.
Disc degeneration asymmetric to the left. Moderate to advanced facet
degeneration. Moderate to severe spinal stenosis. Moderate
subarticular stenosis on the left. Moderate to severe subarticular
and foraminal stenosis on the left

L4-5: Central disc protrusion and advanced facet degeneration.
Severe spinal stenosis. Moderate to severe subarticular and
foraminal stenosis on the left. Moderate right subarticular stenosis

L5-S1: Bilateral facet degeneration.  Negative for stenosis.
IMPRESSION: Lumbar scoliosis. Multilevel degenerative changes throughout the
lumbar spine. Negative for fracture

Right foraminal stenosis T12-L1 and L1-2.

Moderate foraminal stenosis on the left at L2-3 with mild spinal
stenosis

Moderate to severe spinal stenosis L3-4 with bilateral stenosis left
greater than right

Severe spinal stenosis L4-5 with moderate to severe subarticular
foraminal stenosis on the left.

## 2022-10-11 ENCOUNTER — Other Ambulatory Visit: Payer: Self-pay | Admitting: Orthopaedic Surgery
